# Patient Record
Sex: Male | Born: 1995 | Hispanic: Yes | Marital: Married | Smoking: Never smoker
Health system: Southern US, Community
[De-identification: ages and names within clinical notes are randomized; demographics above are authoritative.]

---

## 2020-06-30 ENCOUNTER — Encounter (HOSPITAL_COMMUNITY): Payer: Self-pay

## 2020-06-30 ENCOUNTER — Other Ambulatory Visit: Payer: Self-pay

## 2020-06-30 ENCOUNTER — Emergency Department (HOSPITAL_COMMUNITY)
Admission: EM | Admit: 2020-06-30 | Discharge: 2020-06-30 | Discharge status: Against Medical Advice | Payer: Self-pay | Attending: Emergency Medicine | Admitting: Emergency Medicine

## 2020-06-30 DIAGNOSIS — R059 Cough, unspecified: Secondary | ICD-10-CM | POA: Insufficient documentation

## 2020-06-30 DIAGNOSIS — Z5321 Procedure and treatment not carried out due to patient leaving prior to being seen by health care provider: Secondary | ICD-10-CM | POA: Insufficient documentation

## 2020-06-30 DIAGNOSIS — R509 Fever, unspecified: Secondary | ICD-10-CM | POA: Insufficient documentation

## 2020-06-30 MED ORDER — ACETAMINOPHEN 325 MG PO TABS
650.0000 mg | ORAL_TABLET | Freq: Once | ORAL | Status: AC | PRN
  Administered 2020-06-30: 650 mg via ORAL
  Filled 2020-06-30: qty 2

## 2020-06-30 NOTE — ED Triage Notes (Signed)
Patient c/o fever and cough x 5 days.

## 2020-07-01 ENCOUNTER — Encounter (HOSPITAL_COMMUNITY): Payer: Self-pay

## 2020-07-01 ENCOUNTER — Other Ambulatory Visit: Payer: Self-pay

## 2020-07-01 ENCOUNTER — Emergency Department (HOSPITAL_COMMUNITY): Payer: HRSA Program

## 2020-07-01 ENCOUNTER — Emergency Department (HOSPITAL_COMMUNITY)
Admission: EM | Admit: 2020-07-01 | Discharge: 2020-07-01 | Disposition: A | Discharge status: Home / Self Care | Payer: HRSA Program | Attending: Emergency Medicine | Admitting: Emergency Medicine

## 2020-07-01 DIAGNOSIS — U071 COVID-19: Secondary | ICD-10-CM | POA: Diagnosis not present

## 2020-07-01 DIAGNOSIS — J069 Acute upper respiratory infection, unspecified: Secondary | ICD-10-CM

## 2020-07-01 DIAGNOSIS — R059 Cough, unspecified: Secondary | ICD-10-CM | POA: Diagnosis present

## 2020-07-01 LAB — RESP PANEL BY RT-PCR (FLU A&B, COVID) ARPGX2
Influenza A by PCR: NEGATIVE
Influenza B by PCR: NEGATIVE
SARS Coronavirus 2 by RT PCR: POSITIVE — AB

## 2020-07-01 MED ORDER — KETOROLAC TROMETHAMINE 15 MG/ML IJ SOLN
15.0000 mg | Freq: Once | INTRAMUSCULAR | Status: AC
  Administered 2020-07-01: 08:00:00 15 mg via INTRAMUSCULAR
  Filled 2020-07-01: qty 1

## 2020-07-01 NOTE — ED Provider Notes (Signed)
[facility]-EMERGENCY DEPT Provider Note   CSN: [id number] Arrival date & time: 07/01/20  0009     History Chief Complaint  Patient presents with   Cough    Johnny Bass is a 24 y.o. male.   Cough Cough characteristics:  Non-productive Severity:  Moderate Onset quality:  Gradual Duration:  6 days Timing:  Constant Progression:  Waxing and waning Chronicity:  New Context: upper respiratory infection   Relieved by:  Nothing Worsened by:  Nothing Ineffective treatments: otc meds. Associated symptoms: chills, fever, headaches, myalgias, rhinorrhea and shortness of breath   Associated symptoms: no chest pain and no rash        History reviewed. No pertinent past medical history.  There are no problems to display for this patient.   History reviewed. No pertinent surgical history.     Family History  Family history unknown: Yes    Social History   Tobacco Use   Smoking status: Never Smoker   Smokeless tobacco: Never Used  Vaping Use   Vaping Use: Never used  Substance Use Topics   Alcohol use: Yes   Drug use: Never    Home Medications Prior to Admission medications   Not on File    Allergies    Patient has no known allergies.  Review of Systems   Review of Systems  Constitutional: Positive for chills and fever.  HENT: Positive for congestion and rhinorrhea.   Respiratory: Positive for cough and shortness of breath.   Cardiovascular: Negative for chest pain and palpitations.  Gastrointestinal: Negative for diarrhea, nausea and vomiting.  Genitourinary: Negative for difficulty urinating and dysuria.  Musculoskeletal: Positive for myalgias. Negative for arthralgias and back pain.  Skin: Negative for color change and rash.  Neurological: Positive for headaches. Negative for light-headedness.    Physical Exam Updated Vital Signs BP 109/75 (BP Location: Left Arm)    Pulse 95    Temp 98.5 F (36.9 C) (Oral)     Resp 14    Ht 5\' 3"  (1.6 m)    Wt 65.8 kg    SpO2 95%    BMI 25.69 kg/m   Physical Exam Vitals and nursing note reviewed.  Constitutional:      General: He is not in acute distress.    Appearance: Normal appearance.  HENT:     Head: Normocephalic and atraumatic.     Nose: No rhinorrhea.     Mouth/Throat:     Mouth: Mucous membranes are moist.  Eyes:     General:        Right eye: No discharge.        Left eye: No discharge.     Conjunctiva/sclera: Conjunctivae normal.  Cardiovascular:     Rate and Rhythm: Normal rate and regular rhythm.  Pulmonary:     Effort: Pulmonary effort is normal. No respiratory distress.     Breath sounds: No stridor. No wheezing or rales.  Abdominal:     General: Abdomen is flat. There is no distension.     Palpations: Abdomen is soft.  Musculoskeletal:        General: No deformity or signs of injury.  Skin:    General: Skin is warm and dry.     Capillary Refill: Capillary refill takes less than 2 seconds.  Neurological:     General: No focal deficit present.     Mental Status: He is alert. Mental status is at baseline.     Motor: No weakness.  Psychiatric:  Mood and Affect: Mood normal.        Behavior: Behavior normal.        Thought Content: Thought content normal.     ED Results / Procedures / Treatments   Labs (all labs ordered are listed, but only abnormal results are displayed) Labs Reviewed  RESP PANEL BY RT-PCR (FLU A&B, COVID) ARPGX2 - Abnormal; Notable for the following components:      Result Value   SARS Coronavirus 2 by RT PCR POSITIVE (*)    All other components within normal limits    EKG None  Radiology DG Chest 2 View  Result Date: 07/01/2020 CLINICAL DATA:  Cough, body aches EXAM: CHEST - 2 VIEW COMPARISON:  None. FINDINGS: Patchy bilateral airspace disease concerning for pneumonia. Heart is normal size. No effusions or acute bony abnormality. IMPRESSION: Patchy bilateral airspace disease concerning for  pneumonia. COVID pneumonia could have this appearance. Electronically Signed   By: Charlett Nose M.D.   On: 07/01/2020 01:27    Procedures Procedures (including critical care time)  Medications Ordered in ED Medications  ketorolac (TORADOL) 15 MG/ML injection 15 mg (has no administration in time range)    ED Course  I have reviewed the triage vital signs and the nursing notes.  Pertinent labs & imaging results that were available during my care of the patient were reviewed by me and considered in my medical decision making (see chart for details).    MDM Rules/Calculators/A&P                          Covid positive, symptoms consistent with viral illness, well-hydrated, tolerating p.o., normal work of breathing, no significant risk factors, PERC negative, does have pain with coughing in the chest.  But no pain at rest no pain with exertion.  Overall well-hydrated well-appearing, antipyretics and anti-inflammatories recommended, Toradol given here.  Chest x-ray reviewed by radiology myself shows signs of viral infiltrate, laboratory testing are needed other than viral swab.  Return precautions provided  Johnny Bass was evaluated in Emergency Department on 07/01/2020 for the symptoms described in the history of present illness. He was evaluated in the context of the global COVID-19 pandemic, which necessitated consideration that the patient might be at risk for infection with the SARS-CoV-2 virus that causes COVID-19. Institutional protocols and algorithms that pertain to the evaluation of patients at risk for COVID-19 are in a state of rapid change based on information released by regulatory bodies including the CDC and federal and state organizations. These policies and algorithms were followed during the patient's care in the ED.  Final Clinical Impression(s) / ED Diagnoses Final diagnoses:  COVID  Viral URI with cough    Rx / DC Orders ED Discharge Orders    None        Sabino Donovan, MD 07/01/20 [phone]

## 2020-07-01 NOTE — Discharge Instructions (Addendum)
You need to stay quarantined for 10 days since onset of symptoms, +24 hours of no symptoms.  Return with any worsening symptoms of shortness of breath or chest pain.  Continue to hydrate well and take over-the-counter pain medication and fever medication as described below  You can take 600 mg of ibuprofen every 6 hours, you can take 1000 mg of Tylenol every 6 hours, you can alternate these every 3 or you can take them together.

## 2020-07-01 NOTE — ED Triage Notes (Addendum)
Pt reports generalized body aches, cough, and fevers for 5 days. Pt was in waiting room. Did not hear name called and re checked in. Tylenol given at prior encounter.

## 2020-07-02 ENCOUNTER — Telehealth: Payer: Self-pay | Admitting: Nurse Practitioner

## 2020-07-02 DIAGNOSIS — U071 COVID-19: Secondary | ICD-10-CM

## 2020-07-02 NOTE — Telephone Encounter (Signed)
Called to Discuss with patient about Covid symptoms and the use of a monoclonal antibody infusion for those with mild to moderate Covid symptoms and at a high risk of hospitalization.     Pt is qualified for this infusion at the [facility] due to co-morbid conditions and/or a member of an at-risk group.     Unable to reach pt. No vm set up. Tried x 2 without answer. No mychart.   Consuello Masse, DNP, AGNP-C [phone] (Infusion Center Hotline)

## 2022-03-15 IMAGING — CR DG CHEST 2V
2 series · 2 of 2 positions shown · non-contrast
Comparison: None.

CLINICAL DATA: Cough, body aches

EXAM:
CHEST - 2 VIEW

[w chest pa]
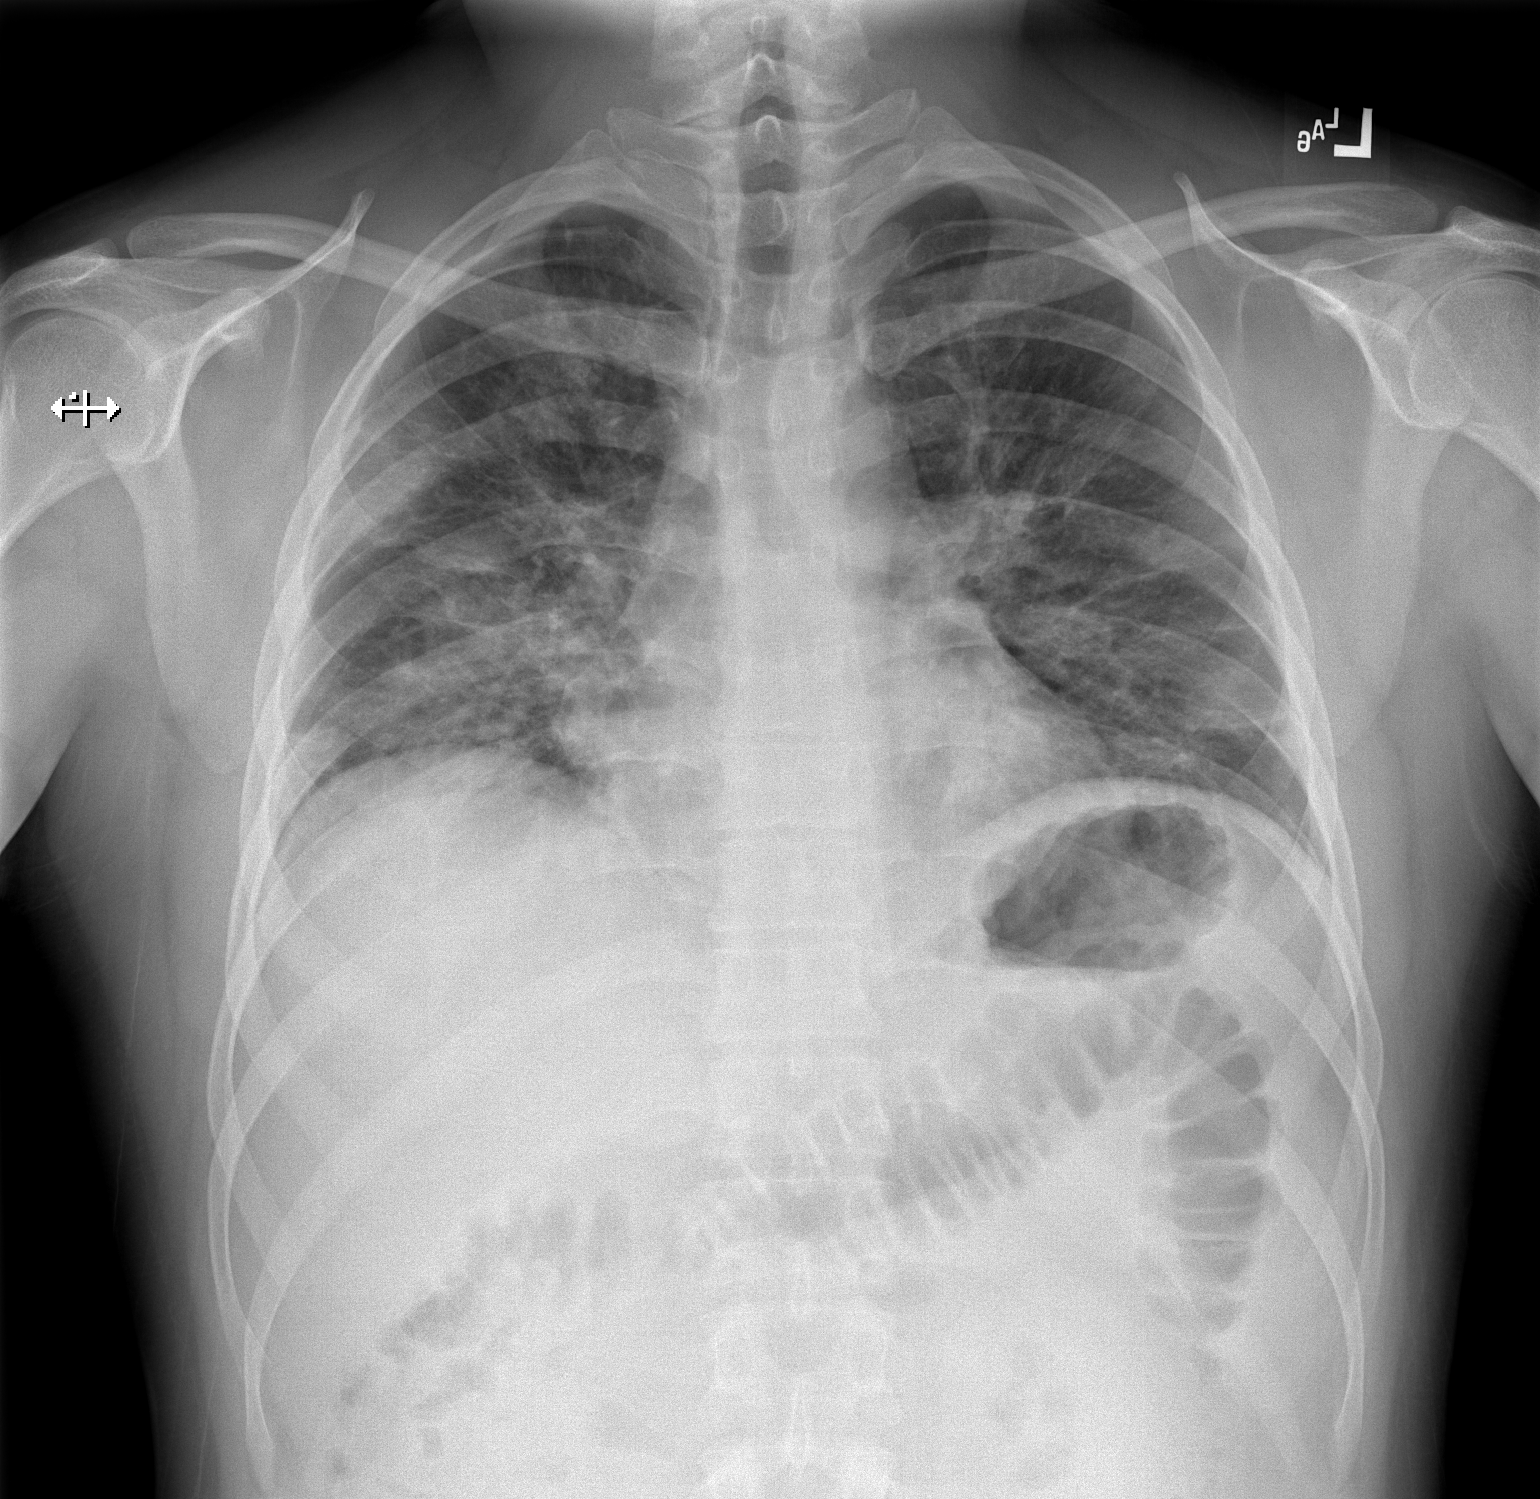

[w chest lat]
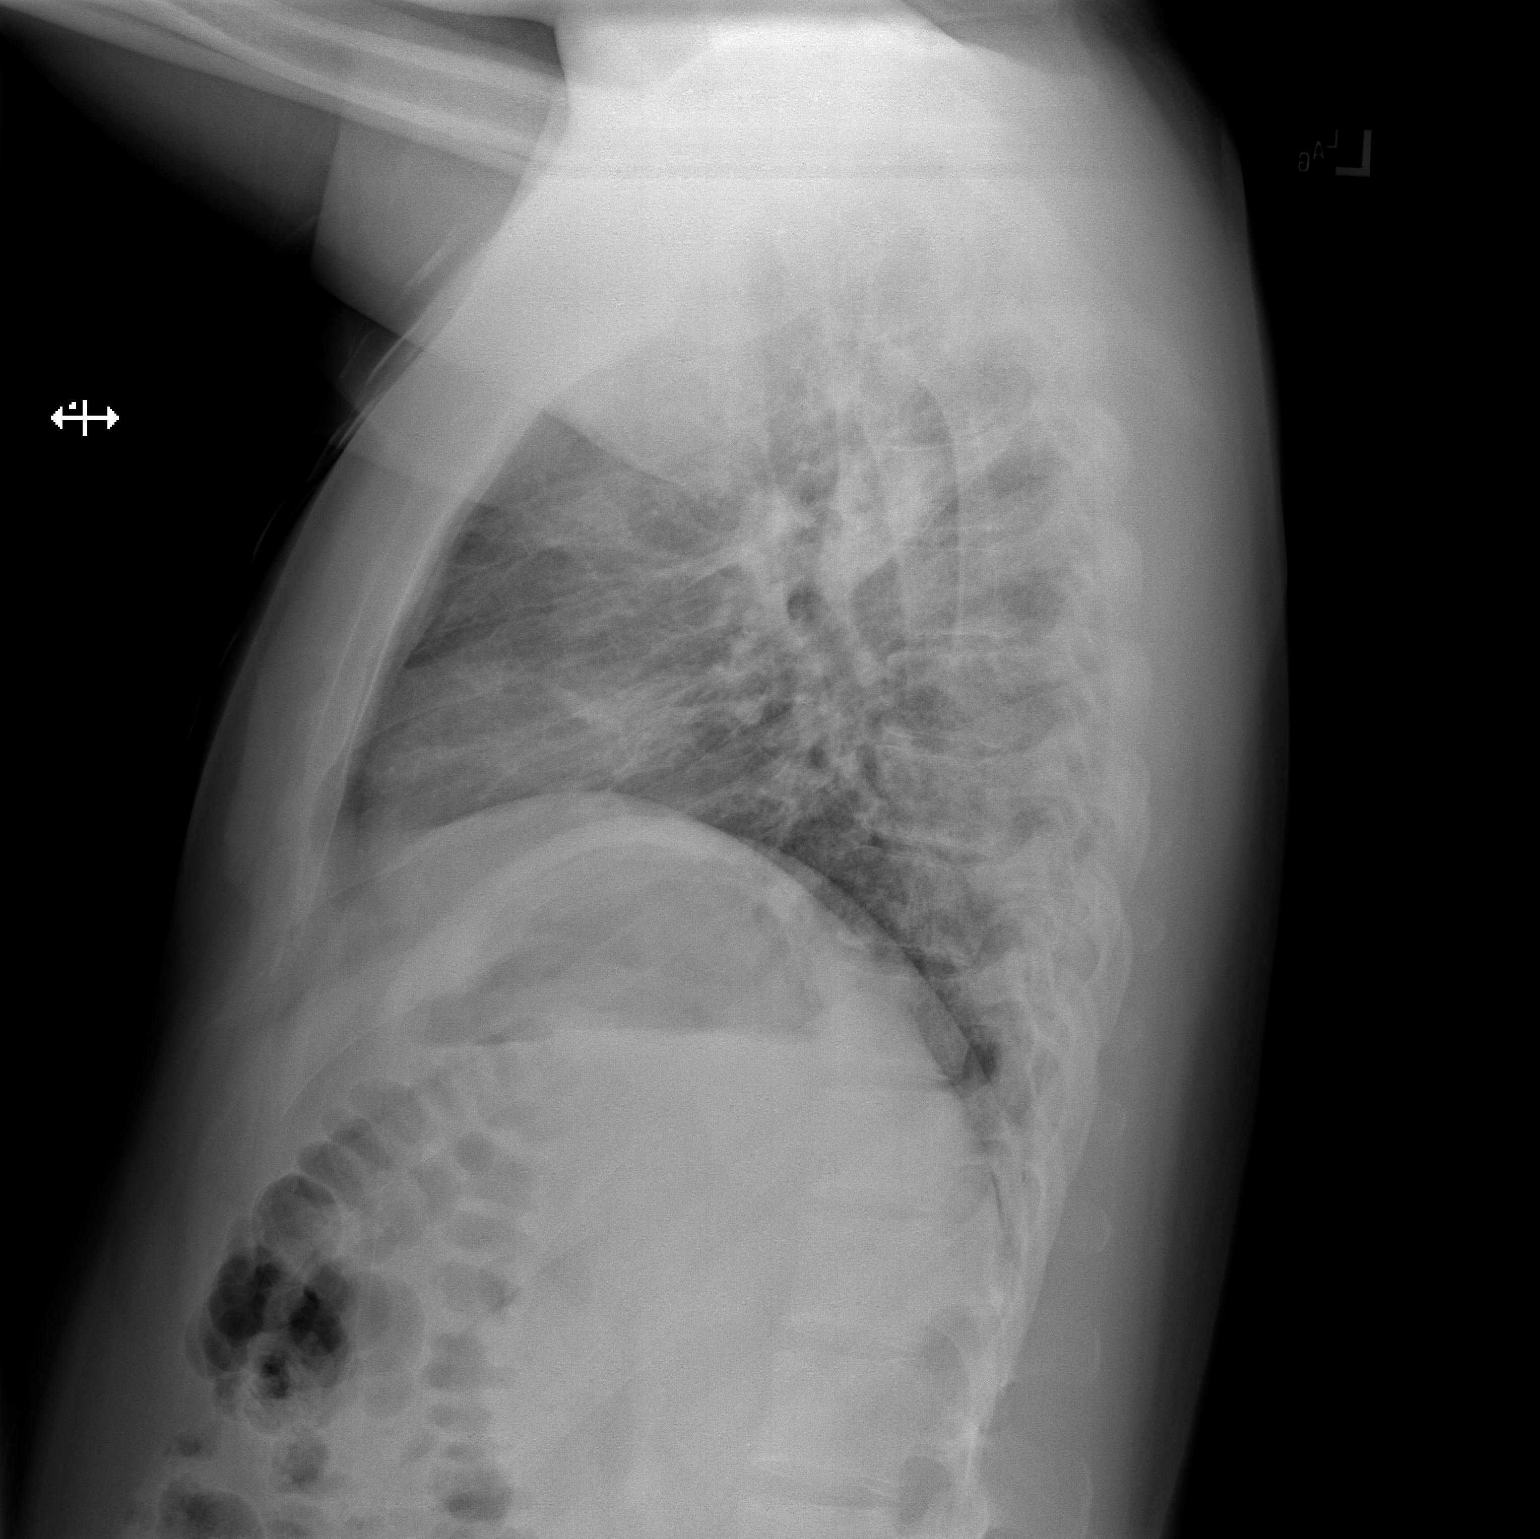

[2 of 2 positions shown; findings below may reference images not displayed]

FINDINGS: Patchy bilateral airspace disease concerning for pneumonia. Heart is
normal size. No effusions or acute bony abnormality.
IMPRESSION: Patchy bilateral airspace disease concerning for pneumonia. COVID
pneumonia could have this appearance.

## 2023-10-25 ENCOUNTER — Emergency Department (HOSPITAL_COMMUNITY)
Admission: EM | Admit: 2023-10-25 | Discharge: 2023-10-25 | Disposition: A | Discharge status: Home / Self Care | Payer: Self-pay | Attending: Emergency Medicine | Admitting: Emergency Medicine

## 2023-10-25 ENCOUNTER — Other Ambulatory Visit: Payer: Self-pay

## 2023-10-25 DIAGNOSIS — Y990 Civilian activity done for income or pay: Secondary | ICD-10-CM | POA: Insufficient documentation

## 2023-10-25 DIAGNOSIS — X500XXA Overexertion from strenuous movement or load, initial encounter: Secondary | ICD-10-CM | POA: Insufficient documentation

## 2023-10-25 DIAGNOSIS — S39012A Strain of muscle, fascia and tendon of lower back, initial encounter: Secondary | ICD-10-CM

## 2023-10-25 MED ORDER — LIDOCAINE 5 % EX PTCH
1.0000 | MEDICATED_PATCH | CUTANEOUS | Status: DC
  Administered 2023-10-25: 1 via TRANSDERMAL
  Filled 2023-10-25: qty 1

## 2023-10-25 MED ORDER — KETOROLAC TROMETHAMINE 15 MG/ML IJ SOLN
15.0000 mg | Freq: Once | INTRAMUSCULAR | Status: AC
  Administered 2023-10-25: 15 mg via INTRAMUSCULAR
  Filled 2023-10-25: qty 1

## 2023-10-25 MED ORDER — LIDOCAINE 5 % EX PTCH: 1.0000 | MEDICATED_PATCH | CUTANEOUS | 0 refills | Status: AC

## 2023-10-25 MED ORDER — CYCLOBENZAPRINE HCL 10 MG PO TABS: 10.0000 mg | ORAL_TABLET | Freq: Two times a day (BID) | ORAL | 0 refills | Status: AC | PRN

## 2023-10-25 NOTE — Discharge Instructions (Addendum)
 Lo atendimos en urgencias por dolor de espalda. Afortunadamente, nuestra evaluacin no indica Psychologist, sport and exercise como compresin o infeccin de la mdula espinal.  A menudo, el dolor remite espontneamente; solo Banker y Media planner de apoyo como ibuprofeno/paracetamol cada 6-8 horas durante los Lovilia. Tome el relajante muscular segn sea necesario (sin embargo, no opere maquinaria ni conduzca despus de usar [employer] debido a sus efectos secundarios sedantes), use los parches de Togo y consulte a su mdico de cabecera para recibir tratamiento adicional si los sntomas persisten.  Realice los ejercicios de espalda para fortalecer los msculos de la espalda y tenga mucho cuidado con las actividades futuras para evitar episodios dolorosos similares.  Regrese a Building control surveyor se vuelve insoportable o si comienza a Land, debilidad, incontinencia urinaria (orinar sobre s mismo sin previo aviso), retencin urinaria (no poder orinar a Scientist, water quality de sentir la vejiga llena), incapacidad para defecar o sensacin de hormigueo en la zona anogenital.  La traduccin se realiz con Google Translate y se hicieron todos los intentos para Physiological scientist.

## 2023-10-25 NOTE — ED Provider Notes (Signed)
  EMERGENCY DEPARTMENT AT [facility] Provider Note   CSN: [id number] Arrival date & time: 10/25/23  1133     History  Chief Complaint  Patient presents with   Back Pain    Johnny Bass is a 28 y.o. male with no past medical history came in due to back pain has been present since last Saturday.  Patient was at work when he has lifting objects and started to feel pain at 1 point.  Patient has popping sensation, saddle anesthesia, urinary or bowel incontinence, radiation of this pain down either leg or up the spine, abdominal pain, nauseous vomiting, IVDU, fevers, new onset weakness or paresthesias.  Patient has tried some OTCs but states that this does not help.  Patient be able walk without issue.  Patient denies any trauma, abdominal pain, dysuria.  Spanish interpreter [phone]  Home Medications Prior to Admission medications   Medication Sig Start Date End Date Taking? Authorizing Provider  cyclobenzaprine (FLEXERIL) 10 MG tablet Take 1 tablet (10 mg total) by mouth 2 (two) times daily as needed for muscle spasms. 10/25/23  Yes Dionisia Pacholski, Fayrene Fearing T, PA-C  lidocaine (LIDODERM) 5 % Place 1 patch onto the skin daily. Remove & Discard patch within 12 hours or as directed by MD 10/25/23  Yes Netta Corrigan, PA-C      Allergies    Patient has no known allergies.    Review of Systems   Review of Systems  Musculoskeletal:  Positive for back pain.    Physical Exam Updated Vital Signs BP 130/75 (BP Location: Right Arm)   Pulse 66   Temp 98.3 F (36.8 C)   Resp 16   SpO2 98%  Physical Exam Constitutional:      General: He is not in acute distress.    Comments: Resting comfortably on phone  Cardiovascular:     Rate and Rhythm: Normal rate.     Pulses: Normal pulses.  Abdominal:     Palpations: Abdomen is soft.     Tenderness: There is no abdominal tenderness. There is no guarding or rebound.  Musculoskeletal:     Comments: Tenderness to bilateral para  lumbar musculature without midline tenderness 5 out of 5 bilateral hip flexion No midline tenderness or abnormalities palpated  Skin:    General: Skin is warm and dry.     Capillary Refill: Capillary refill takes less than 2 seconds.  Neurological:     Mental Status: He is alert.     Comments: Sensation intact distally 2+ bilateral patellar reflexes Able to ambulate without difficulty or pain  Psychiatric:        Mood and Affect: Mood normal.     ED Results / Procedures / Treatments   Labs (all labs ordered are listed, but only abnormal results are displayed) Labs Reviewed - No data to display  EKG None  Radiology No results found.  Procedures Procedures    Medications Ordered in ED Medications  lidocaine (LIDODERM) 5 % 1 patch (has no administration in time range)  ketorolac (TORADOL) 15 MG/ML injection 15 mg (has no administration in time range)    ED Course/ Medical Decision Making/ A&P                                 Medical Decision Making Risk Prescription drug management.   Johnny Bass 28 y.o. presented today for back pain. Working DDx that I considered at this  time includes, but not limited to, MSK, underlying fracture, epidural hematoma/abscess, cauda equina syndrome, spinal stenosis, spinal malignancy, discitis, spinal infection, spondylitises/ spondylosis, conus medullaris, DDD of the back.  R/o DDx: underlying fracture, epidural hematoma/abscess, cauda equina syndrome, spinal stenosis, spinal malignancy, discitis, spinal infection, spondylitises/ spondylosis, conus medullaris, DDD of the back : less likely due to history of present illness, physical exam, labs/imaging findings.  Review of prior external notes: None  Unique Tests and My Interpretation: None  Social Determinants of Health: uninsured  Discussion with Independent Historian: None  Discussion of Management of Tests: None  Risk: Medium: prescription drug management  Risk  Stratification Score: None  Plan: On exam patient was no acute distress with stable vitals. No neurological deficits and normal neuro exam.  Patient can walk without pain or gait abnormalities.  No loss of bowel or bladder control.  No concern for cauda equina.  No fever, night sweats, weight loss, h/o cancer, IVDU.  RICE protocol and pain medicine indicated and discussed with patient.  Patient given Lidoderm patch here and will be given prescription.  Patient given Flexeril as well.  I spoke to the patient how this medication will make them drowsy and do not operate machinery or drive afterwards to which the patient verbalized understanding acceptance of.  Patient was given return precautions. Patient stable for discharge at this time.  Patient verbalized understanding of plan.  This chart was dictated using voice recognition software.  Despite best efforts to proofread,  errors can occur which can change the documentation meaning.        Final Clinical Impression(s) / ED Diagnoses Final diagnoses:  Strain of lumbar region, initial encounter    Rx / DC Orders ED Discharge Orders          Ordered    lidocaine (LIDODERM) 5 %  Every 24 hours        10/25/23 1641    cyclobenzaprine (FLEXERIL) 10 MG tablet  2 times daily PRN        10/25/23 1641              Netta Corrigan, PA-C 10/25/23 1648    Lonell Grandchild, MD 10/25/23 2118

## 2023-10-25 NOTE — ED Triage Notes (Signed)
 Pt is here for back pain since Saturday.  Triage using LMN language services.  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  No numbness or tingling with this.  Pain is in lower back, non radiating and when moving and walking

## 2023-11-08 ENCOUNTER — Emergency Department (HOSPITAL_COMMUNITY)
Admission: EM | Admit: 2023-11-08 | Discharge: 2023-11-09 | Discharge status: Against Medical Advice | Payer: Self-pay | Attending: Emergency Medicine | Admitting: Emergency Medicine

## 2023-11-08 ENCOUNTER — Other Ambulatory Visit: Payer: Self-pay

## 2023-11-08 ENCOUNTER — Emergency Department (HOSPITAL_COMMUNITY): Payer: Self-pay

## 2023-11-08 DIAGNOSIS — S61012A Laceration without foreign body of left thumb without damage to nail, initial encounter: Secondary | ICD-10-CM | POA: Insufficient documentation

## 2023-11-08 DIAGNOSIS — W270XXA Contact with workbench tool, initial encounter: Secondary | ICD-10-CM | POA: Insufficient documentation

## 2023-11-08 DIAGNOSIS — Y990 Civilian activity done for income or pay: Secondary | ICD-10-CM | POA: Insufficient documentation

## 2023-11-08 DIAGNOSIS — Z5321 Procedure and treatment not carried out due to patient leaving prior to being seen by health care provider: Secondary | ICD-10-CM | POA: Insufficient documentation

## 2023-11-08 MED ORDER — ACETAMINOPHEN 500 MG PO TABS: 1000.0000 mg | ORAL_TABLET | Freq: Once | ORAL | Status: DC

## 2023-11-08 NOTE — ED Triage Notes (Signed)
 Pt represents with laceration to left thumb from a metal tool used at work. Reports pain to thumb radiating into palm of his hand. Unknown last tetanus.   Requires a spanish speaking interpreter.

## 2023-11-08 NOTE — ED Provider Triage Note (Addendum)
 Emergency Medicine Provider Triage Evaluation Note  Stylianos Stradling , a 28 y.o. male  was evaluated in triage.  Pt complains of cutting himself over the left thumb with a metal tool at work earlier today at approximately 5 PM.  He reports pain that radiates down to the palm of his hand.  Unknown last tetanus.  Review of Systems  Positive: As above Negative: As above  Physical Exam  BP 129/88 (BP Location: Right Arm)   Pulse 100   Temp 98.4 F (36.9 C) (Oral)   Resp 20   SpO2 99%  Gen:   Awake, no distress   Resp:  Normal effort  MSK:   Moves extremities without difficulty  Other:  Laceration to the dorsal aspect of the left thumb.  No obvious exposed bone or foreign body.  Able to flex and extend at the left thumb MCP and DIP Intact sensation of the left thumb.  Cap refill of the left thumb intact.  Medical Decision Making  Medically screening exam initiated at 8:37 PM.  Appropriate orders placed.  Dylon Hernandez-Campos was informed that the remainder of the evaluation will be completed by another provider, this initial triage assessment does not replace that evaluation, and the importance of remaining in the ED until their evaluation is complete.  Patient declines pain medicine at this time.   Rexie Catena, PA-C 11/08/23 2037    Rexie Catena, PA-C 11/08/23 2051

## 2023-11-09 NOTE — ED Notes (Signed)
 No answer to be roomed, OTF
